# Patient Record
Sex: Female | Born: 1970 | Hispanic: Yes | Marital: Single | Smoking: Never smoker
Health system: Southern US, Community
[De-identification: ages and names within clinical notes are randomized; demographics above are authoritative.]

## PROBLEM LIST (undated history)

## (undated) DIAGNOSIS — I10 Essential (primary) hypertension: Secondary | ICD-10-CM

## (undated) HISTORY — PX: DILATION AND CURETTAGE OF UTERUS: SHX78

---

## 2013-12-19 ENCOUNTER — Ambulatory Visit: Payer: Self-pay | Admitting: Physician Assistant

## 2016-09-23 ENCOUNTER — Emergency Department
Admission: EM | Admit: 2016-09-23 | Discharge: 2016-09-23 | Disposition: A | Discharge status: Home / Self Care | Payer: Self-pay | Attending: Emergency Medicine | Admitting: Emergency Medicine

## 2016-09-23 ENCOUNTER — Ambulatory Visit
Admission: EM | Admit: 2016-09-23 | Discharge: 2016-09-23 | Disposition: A | Discharge status: Home / Self Care | Payer: Self-pay | Attending: Emergency Medicine | Admitting: Emergency Medicine

## 2016-09-23 ENCOUNTER — Encounter: Payer: Self-pay | Admitting: *Deleted

## 2016-09-23 ENCOUNTER — Ambulatory Visit (INDEPENDENT_AMBULATORY_CARE_PROVIDER_SITE_OTHER): Payer: Self-pay

## 2016-09-23 ENCOUNTER — Emergency Department: Payer: Self-pay

## 2016-09-23 ENCOUNTER — Encounter: Payer: Self-pay | Admitting: Emergency Medicine

## 2016-09-23 DIAGNOSIS — R079 Chest pain, unspecified: Secondary | ICD-10-CM | POA: Insufficient documentation

## 2016-09-23 DIAGNOSIS — R05 Cough: Secondary | ICD-10-CM

## 2016-09-23 DIAGNOSIS — R0602 Shortness of breath: Secondary | ICD-10-CM

## 2016-09-23 DIAGNOSIS — I10 Essential (primary) hypertension: Secondary | ICD-10-CM | POA: Insufficient documentation

## 2016-09-23 DIAGNOSIS — E669 Obesity, unspecified: Secondary | ICD-10-CM | POA: Insufficient documentation

## 2016-09-23 DIAGNOSIS — R531 Weakness: Secondary | ICD-10-CM | POA: Insufficient documentation

## 2016-09-23 DIAGNOSIS — Z8249 Family history of ischemic heart disease and other diseases of the circulatory system: Secondary | ICD-10-CM | POA: Insufficient documentation

## 2016-09-23 DIAGNOSIS — N939 Abnormal uterine and vaginal bleeding, unspecified: Secondary | ICD-10-CM | POA: Insufficient documentation

## 2016-09-23 DIAGNOSIS — R739 Hyperglycemia, unspecified: Secondary | ICD-10-CM | POA: Insufficient documentation

## 2016-09-23 DIAGNOSIS — R062 Wheezing: Secondary | ICD-10-CM | POA: Insufficient documentation

## 2016-09-23 DIAGNOSIS — Q765 Cervical rib: Secondary | ICD-10-CM | POA: Insufficient documentation

## 2016-09-23 DIAGNOSIS — D649 Anemia, unspecified: Secondary | ICD-10-CM | POA: Insufficient documentation

## 2016-09-23 DIAGNOSIS — R059 Cough, unspecified: Secondary | ICD-10-CM

## 2016-09-23 DIAGNOSIS — E119 Type 2 diabetes mellitus without complications: Secondary | ICD-10-CM | POA: Insufficient documentation

## 2016-09-23 HISTORY — DX: Essential (primary) hypertension: I10

## 2016-09-23 LAB — CBC
HCT: 27.1 % — ABNORMAL LOW (ref 35.0–47.0)
HEMOGLOBIN: 8.3 g/dL — AB (ref 12.0–16.0)
MCH: 17.7 pg — AB (ref 26.0–34.0)
MCHC: 30.6 g/dL — AB (ref 32.0–36.0)
MCV: 57.8 fL — AB (ref 80.0–100.0)
Platelets: 445 10*3/uL — ABNORMAL HIGH (ref 150–440)
RBC: 4.68 MIL/uL (ref 3.80–5.20)
RDW: 22.9 % — ABNORMAL HIGH (ref 11.5–14.5)
WBC: 8 10*3/uL (ref 3.6–11.0)

## 2016-09-23 LAB — BASIC METABOLIC PANEL
ANION GAP: 9 (ref 5–15)
Anion gap: 8 (ref 5–15)
BUN: 11 mg/dL (ref 6–20)
BUN: 12 mg/dL (ref 6–20)
CALCIUM: 8.2 mg/dL — AB (ref 8.9–10.3)
CALCIUM: 8.2 mg/dL — AB (ref 8.9–10.3)
CHLORIDE: 104 mmol/L (ref 101–111)
CHLORIDE: 103 mmol/L (ref 101–111)
CO2: 23 mmol/L (ref 22–32)
CO2: 23 mmol/L (ref 22–32)
CREATININE: 0.53 mg/dL (ref 0.44–1.00)
CREATININE: 0.72 mg/dL (ref 0.44–1.00)
GFR calc non Af Amer: >60 mL/min (ref >60)
GFR calc non Af Amer: >60 mL/min (ref >60)
GLUCOSE: 121 mg/dL — AB (ref 65–99)
Glucose, Bld: 275 mg/dL — ABNORMAL HIGH (ref 65–99)
Potassium: 3.7 mmol/L (ref 3.5–5.1)
Potassium: 3.4 mmol/L — ABNORMAL LOW (ref 3.5–5.1)
Sodium: 135 mmol/L (ref 135–145)
Sodium: 135 mmol/L (ref 135–145)

## 2016-09-23 LAB — CBC WITH DIFFERENTIAL/PLATELET
Basophils Absolute: 0.1 10*3/uL (ref 0–0.1)
Basophils Relative: 1 %
Eosinophils Absolute: 0.2 10*3/uL (ref 0–0.7)
Eosinophils Relative: 3 %
HEMATOCRIT: 27.2 % — AB (ref 35.0–47.0)
HEMOGLOBIN: 8.2 g/dL — AB (ref 12.0–16.0)
LYMPHS ABS: 2.4 10*3/uL (ref 1.0–3.6)
LYMPHS PCT: 39 %
MCH: 17.4 pg — ABNORMAL LOW (ref 26.0–34.0)
MCHC: 30.1 g/dL — AB (ref 32.0–36.0)
MCV: 57.9 fL — ABNORMAL LOW (ref 80.0–100.0)
MONO ABS: 0.9 10*3/uL (ref 0.2–0.9)
MONOS PCT: 15 %
NEUTROS ABS: 2.7 10*3/uL (ref 1.4–6.5)
NEUTROS PCT: 42 %
Platelets: 425 10*3/uL (ref 150–440)
RBC: 4.7 MIL/uL (ref 3.80–5.20)
RDW: 23.1 % — AB (ref 11.5–14.5)
WBC: 6.3 10*3/uL (ref 3.6–11.0)

## 2016-09-23 LAB — TROPONIN I: Troponin I: <0.03 ng/mL (ref <0.03)

## 2016-09-23 MED ORDER — FERROUS SULFATE 325 (65 FE) MG PO TABS: 325.0000 mg | ORAL_TABLET | Freq: Every day | ORAL | 0 refills | Status: AC

## 2016-09-23 MED ORDER — PREDNISONE 20 MG PO TABS: 60.0000 mg | ORAL_TABLET | Freq: Every day | ORAL | 0 refills | Status: AC

## 2016-09-23 MED ORDER — BENZONATATE 100 MG PO CAPS: 100.0000 mg | ORAL_CAPSULE | Freq: Four times a day (QID) | ORAL | 0 refills | Status: AC | PRN

## 2016-09-23 MED ORDER — ALBUTEROL SULFATE HFA 108 (90 BASE) MCG/ACT IN AERS: 2.0000 | INHALATION_SPRAY | RESPIRATORY_TRACT | 0 refills | Status: AC | PRN

## 2016-09-23 MED ORDER — PREDNISONE 20 MG PO TABS
60.0000 mg | ORAL_TABLET | Freq: Once | ORAL | Status: AC
  Administered 2016-09-23: 60 mg via ORAL
  Filled 2016-09-23: qty 3

## 2016-09-23 MED ORDER — IPRATROPIUM-ALBUTEROL 0.5-2.5 (3) MG/3ML IN SOLN
3.0000 mL | Freq: Once | RESPIRATORY_TRACT | Status: AC
  Administered 2016-09-23: 3 mL via RESPIRATORY_TRACT

## 2016-09-23 MED ORDER — HYDROCHLOROTHIAZIDE 12.5 MG PO TABS: 25.0000 mg | ORAL_TABLET | Freq: Every day | ORAL | 0 refills | Status: AC

## 2016-09-23 NOTE — ED Triage Notes (Signed)
Pt to ED c/o chest pain and SOB for about a week progressively getting worse.  Patient seen at urgent care for same and sent to ED for evaluation.  Patient states pressure to central chest radiating to back, worse with breathing and movement, non productive cough.  Presents A&O, chest rise even and unlabored, skin warm and dry.  Interpreter used in triage.
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
## 2016-09-23 NOTE — Discharge Instructions (Signed)
Go immediately to the [facility] emergency department. They are expecting you. Let them know if your chest pain changes, gets worse, or if your shortness of breath gets worse

## 2016-09-23 NOTE — Discharge Instructions (Signed)
Please take a daily iron supplement for your low blood counts. [facility] primary care physician who can recheck your blood counts. Your doctor will also want to recheck your blood pressure, as well as her blood sugar, both of which were elevated today.  For cough, you may take Tessalon Perles or an albuterol inhaler. Please return to the emergency department if you develop severe pain, lightheadedness or fainting, fever, shortness of breath or chest pain, or any other symptoms concerning to you.
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
## 2016-09-23 NOTE — ED Notes (Signed)
Pt had chest x-ray performed at [facility] urgent care earlier today.  ED order for x-ray to be cancelled.

## 2018-12-02 IMAGING — CR DG CHEST 2V
2 series · 2 of 2 positions shown · non-contrast
Comparison: None.

CLINICAL DATA: Cough and shortness of breath.

EXAM:
CHEST  2 VIEW

[chest lat]
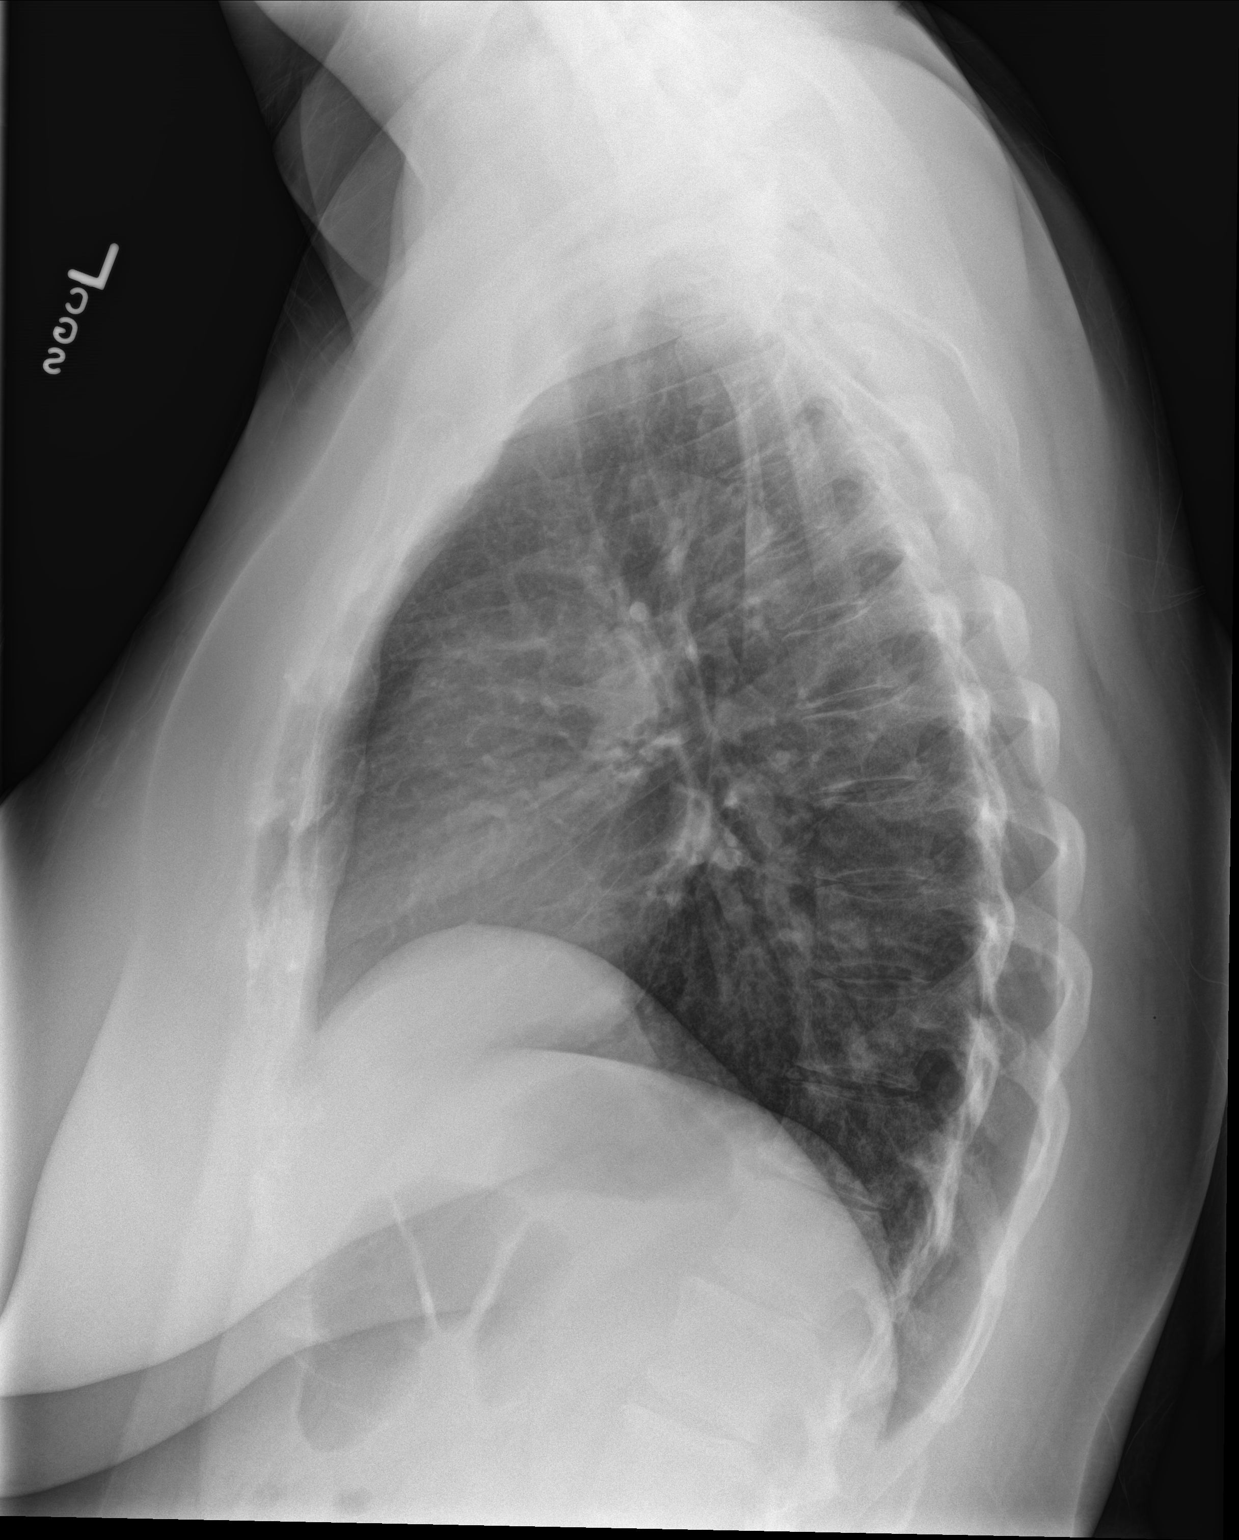

[chest pa]
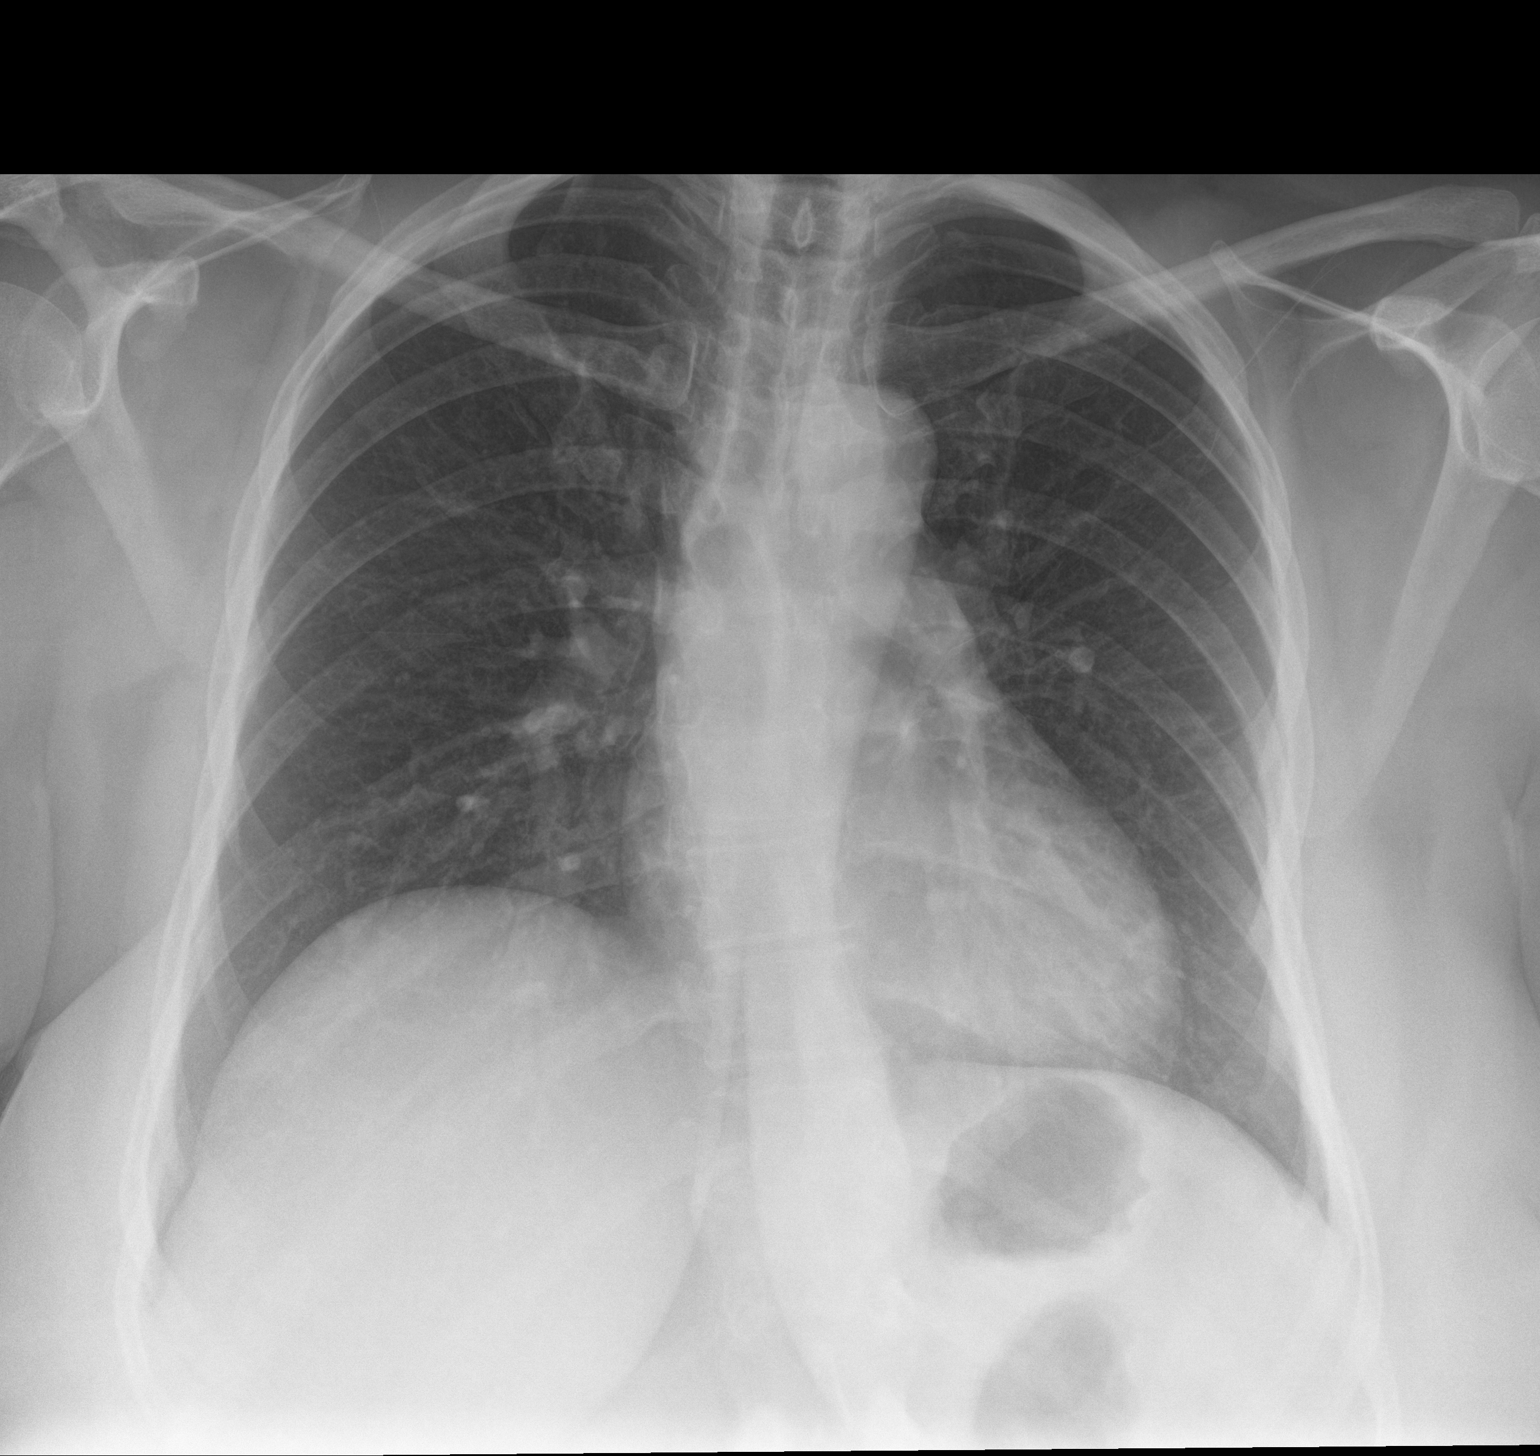

[2 of 2 positions shown; findings below may reference images not displayed]

FINDINGS: Normal heart size and mediastinal contours. No acute infiltrate or
edema. No effusion or pneumothorax. Small cervical rib on the right.
Thoracic dextrocurvature. No acute osseous findings.
IMPRESSION: No evidence of cardiopulmonary disease.
